# Patient Record
Sex: Male | Born: 1965 | Race: White | Hispanic: No | Marital: Married | Smoking: Former smoker
Health system: Southern US, Community
[De-identification: ages and names within clinical notes are randomized; demographics above are authoritative.]

## PROBLEM LIST (undated history)

## (undated) DIAGNOSIS — R011 Cardiac murmur, unspecified: Secondary | ICD-10-CM

## (undated) DIAGNOSIS — K219 Gastro-esophageal reflux disease without esophagitis: Secondary | ICD-10-CM

## (undated) DIAGNOSIS — I10 Essential (primary) hypertension: Secondary | ICD-10-CM

## (undated) DIAGNOSIS — M5410 Radiculopathy, site unspecified: Secondary | ICD-10-CM

## (undated) DIAGNOSIS — G4730 Sleep apnea, unspecified: Secondary | ICD-10-CM

## (undated) HISTORY — PX: BACK SURGERY: SHX140

## (undated) HISTORY — PX: KNEE ARTHROSCOPY W/ ACL RECONSTRUCTION: SHX1858

## (undated) HISTORY — PX: HERNIA REPAIR: SHX51

---

## 2015-06-02 ENCOUNTER — Other Ambulatory Visit: Payer: Self-pay | Admitting: Orthopedic Surgery

## 2015-06-12 ENCOUNTER — Encounter (HOSPITAL_COMMUNITY): Payer: Self-pay

## 2015-06-12 ENCOUNTER — Ambulatory Visit (HOSPITAL_COMMUNITY)
Admission: RE | Admit: 2015-06-12 | Discharge: 2015-06-12 | Disposition: A | Discharge status: Home / Self Care | Payer: Worker's Compensation | Source: Ambulatory Visit | Attending: Orthopedic Surgery | Admitting: Orthopedic Surgery

## 2015-06-12 ENCOUNTER — Encounter (HOSPITAL_COMMUNITY)
Admission: RE | Admit: 2015-06-12 | Discharge: 2015-06-12 | Disposition: A | Discharge status: Home / Self Care | Payer: Worker's Compensation | Source: Ambulatory Visit | Attending: Orthopedic Surgery | Admitting: Orthopedic Surgery

## 2015-06-12 DIAGNOSIS — R9431 Abnormal electrocardiogram [ECG] [EKG]: Secondary | ICD-10-CM | POA: Diagnosis not present

## 2015-06-12 DIAGNOSIS — Z01818 Encounter for other preprocedural examination: Secondary | ICD-10-CM

## 2015-06-12 DIAGNOSIS — Z87891 Personal history of nicotine dependence: Secondary | ICD-10-CM | POA: Insufficient documentation

## 2015-06-12 DIAGNOSIS — Z7982 Long term (current) use of aspirin: Secondary | ICD-10-CM | POA: Diagnosis not present

## 2015-06-12 DIAGNOSIS — Z0183 Encounter for blood typing: Secondary | ICD-10-CM | POA: Diagnosis not present

## 2015-06-12 DIAGNOSIS — Z79899 Other long term (current) drug therapy: Secondary | ICD-10-CM | POA: Insufficient documentation

## 2015-06-12 DIAGNOSIS — M5410 Radiculopathy, site unspecified: Secondary | ICD-10-CM | POA: Diagnosis not present

## 2015-06-12 DIAGNOSIS — K219 Gastro-esophageal reflux disease without esophagitis: Secondary | ICD-10-CM | POA: Diagnosis not present

## 2015-06-12 DIAGNOSIS — G4733 Obstructive sleep apnea (adult) (pediatric): Secondary | ICD-10-CM | POA: Insufficient documentation

## 2015-06-12 DIAGNOSIS — I10 Essential (primary) hypertension: Secondary | ICD-10-CM | POA: Insufficient documentation

## 2015-06-12 DIAGNOSIS — Z01812 Encounter for preprocedural laboratory examination: Secondary | ICD-10-CM | POA: Diagnosis not present

## 2015-06-12 HISTORY — DX: Gastro-esophageal reflux disease without esophagitis: K21.9

## 2015-06-12 HISTORY — DX: Essential (primary) hypertension: I10

## 2015-06-12 HISTORY — DX: Cardiac murmur, unspecified: R01.1

## 2015-06-12 HISTORY — DX: Radiculopathy, site unspecified: M54.10

## 2015-06-12 HISTORY — DX: Sleep apnea, unspecified: G47.30

## 2015-06-12 LAB — URINALYSIS, ROUTINE W REFLEX MICROSCOPIC
Bilirubin Urine: NEGATIVE
Glucose, UA: NEGATIVE mg/dL
Hgb urine dipstick: NEGATIVE
KETONES UR: NEGATIVE mg/dL
NITRITE: NEGATIVE
PH: 6.5 (ref 5.0–8.0)
PROTEIN: NEGATIVE mg/dL
Specific Gravity, Urine: 1.015 (ref 1.005–1.030)

## 2015-06-12 LAB — CBC WITH DIFFERENTIAL/PLATELET
BASOS ABS: 0 10*3/uL (ref 0.0–0.1)
BASOS PCT: 0 %
EOS PCT: 3 %
Eosinophils Absolute: 0.2 10*3/uL (ref 0.0–0.7)
HCT: 44.4 % (ref 39.0–52.0)
Hemoglobin: 15.1 g/dL (ref 13.0–17.0)
Lymphocytes Relative: 30 %
Lymphs Abs: 2.1 10*3/uL (ref 0.7–4.0)
MCH: 30.5 pg (ref 26.0–34.0)
MCHC: 34 g/dL (ref 30.0–36.0)
MCV: 89.7 fL (ref 78.0–100.0)
MONO ABS: 0.5 10*3/uL (ref 0.1–1.0)
Monocytes Relative: 7 %
Neutro Abs: 4.3 10*3/uL (ref 1.7–7.7)
Neutrophils Relative %: 60 %
PLATELETS: 198 10*3/uL (ref 150–400)
RBC: 4.95 MIL/uL (ref 4.22–5.81)
RDW: 12.1 % (ref 11.5–15.5)
WBC: 7.1 10*3/uL (ref 4.0–10.5)

## 2015-06-12 LAB — COMPREHENSIVE METABOLIC PANEL
ALT: 59 U/L (ref 17–63)
AST: 56 U/L — AB (ref 15–41)
Albumin: 4 g/dL (ref 3.5–5.0)
Alkaline Phosphatase: 58 U/L (ref 38–126)
Anion gap: 7 (ref 5–15)
BUN: 10 mg/dL (ref 6–20)
CHLORIDE: 105 mmol/L (ref 101–111)
CO2: 27 mmol/L (ref 22–32)
Calcium: 9.7 mg/dL (ref 8.9–10.3)
Creatinine, Ser: 0.79 mg/dL (ref 0.61–1.24)
GFR calc Af Amer: >60 mL/min (ref >60)
Glucose, Bld: 120 mg/dL — ABNORMAL HIGH (ref 65–99)
POTASSIUM: 3.7 mmol/L (ref 3.5–5.1)
Sodium: 139 mmol/L (ref 135–145)
Total Bilirubin: 0.7 mg/dL (ref 0.3–1.2)
Total Protein: 7 g/dL (ref 6.5–8.1)

## 2015-06-12 LAB — TYPE AND SCREEN
ABO/RH(D): A POS
ANTIBODY SCREEN: NEGATIVE

## 2015-06-12 LAB — ABO/RH: ABO/RH(D): A POS

## 2015-06-12 LAB — URINE MICROSCOPIC-ADD ON
RBC / HPF: NONE SEEN RBC/hpf (ref 0–5)
Squamous Epithelial / LPF: NONE SEEN

## 2015-06-12 LAB — PROTIME-INR
INR: 1.12 (ref 0.00–1.49)
PROTHROMBIN TIME: 14.6 s (ref 11.6–15.2)

## 2015-06-12 LAB — SURGICAL PCR SCREEN
MRSA, PCR: NEGATIVE
STAPHYLOCOCCUS AUREUS: POSITIVE — AB

## 2015-06-12 LAB — APTT: APTT: 29 s (ref 24–37)
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
## 2015-06-12 NOTE — Progress Notes (Signed)
Pt denies SOB, chest pain, and being under the care of a cardiologist. Pt denies having a stress test, echo and cardiac cath. Pt denies having a chest x ray and EKG within the last year. Pt made aware to stop taking Aspirin, vitamins, fish oil and herbal medications. Do not take any NSAIDs ie: Ibuprofen, Advil, Naproxen, BC and Goody Powder or any medication containing Aspirin. Pt verbalized understanding of all pre-op instructions. 

## 2015-06-12 NOTE — Progress Notes (Signed)
Please treat with Betadine on DOS for positive Staph result. Pt chart forwarded to anesthesia to review EKG.

## 2015-06-12 NOTE — Pre-Procedure Instructions (Addendum)
Earl LitesGregory Hession  06/12/2015      [facility] [place] [address] [place] [id number] Phone: [phone] Fax: [phone]    Your procedure is scheduled on Thursday, June 15, 2015  [facility] at 5:30 A.M.  Call this number if you have problems the morning of surgery:  [phone]   Remember:  Do not eat food or drink liquids after midnight Wednesday, June 14, 2015  Take these medicines the morning of surgery with A SIP OF WATER : atenolol (TENORMIN), omeprazole (PRILOSEC)  Stop taking Aspirin, vitamins, fish oil and herbal medications. Do not take any NSAIDs ie: Ibuprofen, Advil, Naproxen, BC Powder and Goody's or any medication containing Aspirin; stop now.  Do not wear jewelry, make-up or nail polish.  Do not wear lotions, powders, or perfumes.  You may not wear deodorant.  Do not shave 48 hours prior to surgery.  Men may shave face and neck.  Do not bring valuables to the hospital.  [facility] is not responsible for any belongings or valuables.  Contacts, dentures or bridgework may not be worn into surgery.  Leave your suitcase in the car.  After surgery it may be brought to your room.  For patients admitted to the hospital, discharge time will be determined by your treatment team.  Patients discharged the day of surgery will not be allowed to drive home.   Name and phone number of your driver:  Special instructions:  Special Instructions:Special Instructions: [facility] - Preparing for [facility]  Before surgery, you can play an important role.  Because skin is not sterile, your skin needs to be as free of germs as possible.  You can reduce the number of germs on you skin by washing with CHG (chlorahexidine gluconate) soap before surgery.  CHG is an antiseptic cleaner which kills germs and bonds with the skin to continue killing germs even after washing.  Please DO NOT use if you have an  allergy to CHG or antibacterial soaps.  If your skin becomes reddened/irritated stop using the CHG and inform your nurse when you arrive at Short Stay.  Do not shave (including legs and underarms) for at least 48 hours prior to the first CHG shower.  You may shave your face.  Please follow these instructions carefully:   1.  Shower with CHG Soap the night before surgery and the morning of Surgery.  2.  If you choose to wash your hair, wash your hair first as usual with your normal shampoo.  3.  After you shampoo, rinse your hair and body thoroughly to remove the Shampoo.  4.  Use CHG as you would any other liquid soap.  You can apply chg directly  to the skin and wash gently with scrungie or a clean washcloth.  5.  Apply the CHG Soap to your body ONLY FROM THE NECK DOWN.  Do not use on open wounds or open sores.  Avoid contact with your eyes, ears, mouth and genitals (private parts).  Wash genitals (private parts) with your normal soap.  6.  Wash thoroughly, paying special attention to the area where your surgery will be performed.  7.  Thoroughly rinse your body with warm water from the neck down.  8.  DO NOT shower/wash with your normal soap after using and rinsing off the CHG Soap.  9.  Pat yourself dry with a clean towel.  10.  Wear clean pajamas.            11.  Place clean sheets on your bed the night of your first shower and do not sleep with pets.  Day of Surgery  Do not apply any lotions/deodorants the morning of surgery.  Please wear clean clothes to the hospital/surgery center.  Please read over the following fact sheets that you were given. Pain Booklet, Coughing and Deep Breathing, Blood Transfusion Information, MRSA Information and Surgical Site Infection Prevention

## 2015-06-13 NOTE — Progress Notes (Signed)
Anesthesia Chart Review:  Pt is a 50 year old male scheduled for C5-6, C6-7 posterior cervical decompression fusion on 06/15/2015 with Dr. Yevette Edwardsumonski.   PCP is Elder Negusavid Sanders, PA  PMH includes:  HTN, heart murmur (as a child), OSA, GERD. Former smoker. BMI 39  Medications include: ASA, atenolol, lisinopril-hctz, prilosec, crestor  Preoperative labs reviewed.    Chest x-ray 06/12/15 reviewed. No active cardiopulmonary disease  EKG 06/12/15: Sinus rhythm with 1st degree A-V block. LAD. Non-specific intra-ventricular conduction block  If no changes, I anticipate pt can proceed with surgery as scheduled.   Rica Mastngela Laird Runnion, FNP-BC [facility]/Anesthesiology Phone: [phone] 06/13/2015 12:53 PM

## 2015-06-14 MED ORDER — DEXTROSE 5 % IV SOLN
3.0000 g | INTRAVENOUS | Status: AC
  Administered 2015-06-15: 3 g via INTRAVENOUS
  Filled 2015-06-14: qty 3000

## 2015-06-14 NOTE — Anesthesia Preprocedure Evaluation (Addendum)
Anesthesia Evaluation  Patient identified by MRN, date of birth, ID band Patient awake    Reviewed: Allergy & Precautions, NPO status , Patient's Chart, lab work & pertinent test results  Airway Mallampati: II  TM Distance: >3 FB Neck ROM: Full    Dental  (+) Teeth Intact, Dental Advisory Given   Pulmonary sleep apnea and Continuous Positive Airway Pressure Ventilation , former smoker,    breath sounds clear to auscultation       Cardiovascular hypertension, Pt. on medications and Pt. on home beta blockers  Rhythm:Regular Rate:Normal     Neuro/Psych  Neuromuscular disease    GI/Hepatic Neg liver ROS, GERD  Medicated and Controlled,  Endo/Other  negative endocrine ROSMorbid obesity  Renal/GU negative Renal ROS     Musculoskeletal negative musculoskeletal ROS (+)   Abdominal   Peds  Hematology negative hematology ROS (+)   Anesthesia Other Findings   Reproductive/Obstetrics                           Lab Results  Component Value Date   WBC 7.1 06/12/2015   HGB 15.1 06/12/2015   HCT 44.4 06/12/2015   MCV 89.7 06/12/2015   PLT 198 06/12/2015   Lab Results  Component Value Date   CREATININE 0.79 06/12/2015   BUN 10 06/12/2015   NA 139 06/12/2015   K 3.7 06/12/2015   CL 105 06/12/2015   CO2 27 06/12/2015    Anesthesia Physical Anesthesia Plan  ASA: II  Anesthesia Plan: General   Post-op Pain Management:    Induction: Intravenous  Airway Management Planned: Oral ETT and Video Laryngoscope Planned  Additional Equipment:   Intra-op Plan:   Post-operative Plan: Extubation in OR  Informed Consent: I have reviewed the patients History and Physical, chart, labs and discussed the procedure including the risks, benefits and alternatives for the proposed anesthesia with the patient or authorized representative who has indicated his/her understanding and acceptance.   Dental  advisory given  Plan Discussed with: CRNA, Anesthesiologist and Surgeon  Anesthesia Plan Comments:       Anesthesia Quick Evaluation

## 2015-06-15 ENCOUNTER — Observation Stay (HOSPITAL_COMMUNITY)
Admission: RE | Admit: 2015-06-15 | Discharge: 2015-06-16 | Disposition: A | Discharge status: Home / Self Care | Payer: Worker's Compensation | Source: Ambulatory Visit | Attending: Orthopedic Surgery | Admitting: Orthopedic Surgery

## 2015-06-15 ENCOUNTER — Encounter (HOSPITAL_COMMUNITY)
Admission: RE | Disposition: A | Discharge status: Home / Self Care | Payer: Self-pay | Source: Ambulatory Visit | Attending: Orthopedic Surgery

## 2015-06-15 ENCOUNTER — Ambulatory Visit (HOSPITAL_COMMUNITY): Payer: Worker's Compensation | Admitting: Anesthesiology

## 2015-06-15 ENCOUNTER — Ambulatory Visit (HOSPITAL_COMMUNITY): Payer: Worker's Compensation

## 2015-06-15 ENCOUNTER — Ambulatory Visit (HOSPITAL_COMMUNITY): Payer: Worker's Compensation | Admitting: Emergency Medicine

## 2015-06-15 ENCOUNTER — Encounter (HOSPITAL_COMMUNITY): Payer: Self-pay | Admitting: *Deleted

## 2015-06-15 DIAGNOSIS — K219 Gastro-esophageal reflux disease without esophagitis: Secondary | ICD-10-CM | POA: Diagnosis not present

## 2015-06-15 DIAGNOSIS — G4730 Sleep apnea, unspecified: Secondary | ICD-10-CM | POA: Diagnosis not present

## 2015-06-15 DIAGNOSIS — I10 Essential (primary) hypertension: Secondary | ICD-10-CM | POA: Insufficient documentation

## 2015-06-15 DIAGNOSIS — Z6839 Body mass index (BMI) 39.0-39.9, adult: Secondary | ICD-10-CM | POA: Insufficient documentation

## 2015-06-15 DIAGNOSIS — S129XXA Fracture of neck, unspecified, initial encounter: Secondary | ICD-10-CM

## 2015-06-15 DIAGNOSIS — M960 Pseudarthrosis after fusion or arthrodesis: Secondary | ICD-10-CM | POA: Diagnosis present

## 2015-06-15 HISTORY — PX: POSTERIOR CERVICAL FUSION/FORAMINOTOMY: SHX5038

## 2015-06-15 SURGERY — POSTERIOR CERVICAL FUSION/FORAMINOTOMY LEVEL 2
Anesthesia: General

## 2015-06-15 MED ORDER — MENTHOL 3 MG MT LOZG: 1.0000 | LOZENGE | OROMUCOSAL | Status: DC | PRN

## 2015-06-15 MED ORDER — POVIDONE-IODINE 7.5 % EX SOLN
Freq: Once | CUTANEOUS | Status: DC
  Filled 2015-06-15: qty 118

## 2015-06-15 MED ORDER — ATENOLOL 100 MG PO TABS
100.0000 mg | ORAL_TABLET | ORAL | Status: DC
  Administered 2015-06-16: 100 mg via ORAL
  Filled 2015-06-15: qty 1

## 2015-06-15 MED ORDER — ROCURONIUM BROMIDE 50 MG/5ML IV SOLN
INTRAVENOUS | Status: AC
  Filled 2015-06-15: qty 1

## 2015-06-15 MED ORDER — FENTANYL CITRATE (PF) 100 MCG/2ML IJ SOLN
INTRAMUSCULAR | Status: DC | PRN
  Administered 2015-06-15: 50 ug via INTRAVENOUS
  Administered 2015-06-15: 100 ug via INTRAVENOUS
  Administered 2015-06-15 (×5): 50 ug via INTRAVENOUS

## 2015-06-15 MED ORDER — ALUM & MAG HYDROXIDE-SIMETH 200-200-20 MG/5ML PO SUSP: 30.0000 mL | Freq: Four times a day (QID) | ORAL | Status: DC | PRN

## 2015-06-15 MED ORDER — HYDROMORPHONE HCL 1 MG/ML IJ SOLN
0.2500 mg | INTRAMUSCULAR | Status: DC | PRN
  Administered 2015-06-15 (×2): 0.5 mg via INTRAVENOUS

## 2015-06-15 MED ORDER — PROPOFOL 10 MG/ML IV BOLUS
INTRAVENOUS | Status: DC | PRN
  Administered 2015-06-15: 40 mg via INTRAVENOUS
  Administered 2015-06-15: 200 mg via INTRAVENOUS

## 2015-06-15 MED ORDER — BACITRACIN ZINC 500 UNIT/GM EX OINT
TOPICAL_OINTMENT | CUTANEOUS | Status: AC
  Filled 2015-06-15: qty 28.35

## 2015-06-15 MED ORDER — HYDROCHLOROTHIAZIDE 25 MG PO TABS
25.0000 mg | ORAL_TABLET | Freq: Every day | ORAL | Status: DC
  Administered 2015-06-15: 25 mg via ORAL
  Filled 2015-06-15: qty 1

## 2015-06-15 MED ORDER — BISACODYL 5 MG PO TBEC: 5.0000 mg | DELAYED_RELEASE_TABLET | Freq: Every day | ORAL | Status: DC | PRN

## 2015-06-15 MED ORDER — ACETAMINOPHEN 325 MG PO TABS: 650.0000 mg | ORAL_TABLET | ORAL | Status: DC | PRN

## 2015-06-15 MED ORDER — SODIUM CHLORIDE 0.9% FLUSH
3.0000 mL | Freq: Two times a day (BID) | INTRAVENOUS | Status: DC
  Administered 2015-06-15 (×2): 3 mL via INTRAVENOUS

## 2015-06-15 MED ORDER — PROPOFOL 10 MG/ML IV BOLUS
INTRAVENOUS | Status: AC
  Filled 2015-06-15: qty 20

## 2015-06-15 MED ORDER — BUPIVACAINE-EPINEPHRINE 0.25% -1:200000 IJ SOLN
INTRAMUSCULAR | Status: DC | PRN
  Administered 2015-06-15: 10 mL

## 2015-06-15 MED ORDER — FENTANYL CITRATE (PF) 250 MCG/5ML IJ SOLN
INTRAMUSCULAR | Status: AC
  Filled 2015-06-15: qty 5

## 2015-06-15 MED ORDER — THROMBIN 20000 UNITS EX SOLR
CUTANEOUS | Status: AC
  Filled 2015-06-15: qty 20000

## 2015-06-15 MED ORDER — LACTATED RINGERS IV SOLN
INTRAVENOUS | Status: DC | PRN
  Administered 2015-06-15: 07:00:00 via INTRAVENOUS

## 2015-06-15 MED ORDER — THROMBIN 20000 UNITS EX KIT
PACK | CUTANEOUS | Status: DC | PRN
  Administered 2015-06-15: 20000 [IU] via TOPICAL

## 2015-06-15 MED ORDER — MORPHINE SULFATE (PF) 2 MG/ML IV SOLN
1.0000 mg | INTRAVENOUS | Status: DC | PRN
  Administered 2015-06-15: 2 mg via INTRAVENOUS
  Filled 2015-06-15: qty 1

## 2015-06-15 MED ORDER — LIDOCAINE HCL (CARDIAC) 20 MG/ML IV SOLN
INTRAVENOUS | Status: DC | PRN
  Administered 2015-06-15: 80 mg via INTRAVENOUS

## 2015-06-15 MED ORDER — MINERAL OIL LIGHT 100 % EX OIL
TOPICAL_OIL | CUTANEOUS | Status: DC | PRN
  Administered 2015-06-15: 1 via TOPICAL

## 2015-06-15 MED ORDER — DIAZEPAM 5 MG PO TABS
ORAL_TABLET | ORAL | Status: AC
  Filled 2015-06-15: qty 1

## 2015-06-15 MED ORDER — FLEET ENEMA 7-19 GM/118ML RE ENEM: 1.0000 | ENEMA | Freq: Once | RECTAL | Status: DC | PRN

## 2015-06-15 MED ORDER — OXYCODONE-ACETAMINOPHEN 5-325 MG PO TABS
1.0000 | ORAL_TABLET | ORAL | Status: DC | PRN
  Administered 2015-06-15 – 2015-06-16 (×4): 2 via ORAL
  Filled 2015-06-15 (×3): qty 2

## 2015-06-15 MED ORDER — MIDAZOLAM HCL 2 MG/2ML IJ SOLN
INTRAMUSCULAR | Status: AC
  Filled 2015-06-15: qty 2

## 2015-06-15 MED ORDER — 0.9 % SODIUM CHLORIDE (POUR BTL) OPTIME
TOPICAL | Status: DC | PRN
  Administered 2015-06-15 (×2): 1000 mL

## 2015-06-15 MED ORDER — SUGAMMADEX SODIUM 200 MG/2ML IV SOLN
INTRAVENOUS | Status: DC | PRN
  Administered 2015-06-15: 200 mg via INTRAVENOUS

## 2015-06-15 MED ORDER — ROCURONIUM BROMIDE 50 MG/5ML IV SOLN
INTRAVENOUS | Status: AC
Start: 2015-06-15 — End: 2015-06-15
  Filled 2015-06-15: qty 2

## 2015-06-15 MED ORDER — EPHEDRINE 5 MG/ML INJ
INTRAVENOUS | Status: AC
  Filled 2015-06-15: qty 10

## 2015-06-15 MED ORDER — VITAMIN D 1000 UNITS PO TABS
2000.0000 [IU] | ORAL_TABLET | ORAL | Status: DC
  Administered 2015-06-16: 2000 [IU] via ORAL
  Filled 2015-06-15: qty 2

## 2015-06-15 MED ORDER — PHENOL 1.4 % MT LIQD
1.0000 | OROMUCOSAL | Status: DC | PRN
Start: 2015-06-15 — End: 2015-06-16

## 2015-06-15 MED ORDER — BACITRACIN ZINC 500 UNIT/GM EX OINT
TOPICAL_OINTMENT | CUTANEOUS | Status: DC | PRN
  Administered 2015-06-15: 1 via TOPICAL

## 2015-06-15 MED ORDER — DIAZEPAM 5 MG PO TABS
5.0000 mg | ORAL_TABLET | Freq: Four times a day (QID) | ORAL | Status: DC | PRN
  Administered 2015-06-15 – 2015-06-16 (×3): 5 mg via ORAL
  Filled 2015-06-15 (×2): qty 1

## 2015-06-15 MED ORDER — ONDANSETRON HCL 4 MG/2ML IJ SOLN
INTRAMUSCULAR | Status: AC
  Filled 2015-06-15: qty 2

## 2015-06-15 MED ORDER — OXYCODONE-ACETAMINOPHEN 5-325 MG PO TABS
ORAL_TABLET | ORAL | Status: AC
  Filled 2015-06-15: qty 2

## 2015-06-15 MED ORDER — PROMETHAZINE HCL 25 MG/ML IJ SOLN: 6.2500 mg | INTRAMUSCULAR | Status: DC | PRN

## 2015-06-15 MED ORDER — ONDANSETRON HCL 4 MG/2ML IJ SOLN
4.0000 mg | INTRAMUSCULAR | Status: DC | PRN
Start: 2015-06-15 — End: 2015-06-16

## 2015-06-15 MED ORDER — HYDROCODONE-ACETAMINOPHEN 5-325 MG PO TABS
1.0000 | ORAL_TABLET | ORAL | Status: DC | PRN
Start: 2015-06-15 — End: 2015-06-16

## 2015-06-15 MED ORDER — MINERAL OIL LIGHT 100 % EX OIL
TOPICAL_OIL | CUTANEOUS | Status: AC
  Filled 2015-06-15: qty 25

## 2015-06-15 MED ORDER — HYDROCODONE-ACETAMINOPHEN 7.5-325 MG PO TABS
1.0000 | ORAL_TABLET | Freq: Once | ORAL | Status: DC | PRN
Start: 2015-06-15 — End: 2015-06-15

## 2015-06-15 MED ORDER — PHENYLEPHRINE HCL 10 MG/ML IJ SOLN
10.0000 mg | INTRAVENOUS | Status: DC | PRN
  Administered 2015-06-15: 40 ug/min via INTRAVENOUS

## 2015-06-15 MED ORDER — SODIUM CHLORIDE 0.9% FLUSH: 3.0000 mL | INTRAVENOUS | Status: DC | PRN

## 2015-06-15 MED ORDER — MIDAZOLAM HCL 5 MG/5ML IJ SOLN
INTRAMUSCULAR | Status: DC | PRN
  Administered 2015-06-15: 2 mg via INTRAVENOUS

## 2015-06-15 MED ORDER — HYDROMORPHONE HCL 1 MG/ML IJ SOLN
INTRAMUSCULAR | Status: AC
  Administered 2015-06-15: 0.5 mg via INTRAVENOUS
  Filled 2015-06-15: qty 1

## 2015-06-15 MED ORDER — LACTATED RINGERS IV SOLN
INTRAVENOUS | Status: DC | PRN
  Administered 2015-06-15 (×2): via INTRAVENOUS

## 2015-06-15 MED ORDER — LISINOPRIL-HYDROCHLOROTHIAZIDE 20-25 MG PO TABS: 1.0000 | ORAL_TABLET | ORAL | Status: DC

## 2015-06-15 MED ORDER — LISINOPRIL 20 MG PO TABS
20.0000 mg | ORAL_TABLET | Freq: Every day | ORAL | Status: DC
  Administered 2015-06-15: 20 mg via ORAL
  Filled 2015-06-15: qty 1

## 2015-06-15 MED ORDER — ONDANSETRON HCL 4 MG/2ML IJ SOLN
INTRAMUSCULAR | Status: DC | PRN
  Administered 2015-06-15: 4 mg via INTRAVENOUS

## 2015-06-15 MED ORDER — MUPIROCIN 2 % EX OINT
1.0000 "application " | TOPICAL_OINTMENT | Freq: Two times a day (BID) | CUTANEOUS | Status: DC
  Administered 2015-06-15: 1 via NASAL

## 2015-06-15 MED ORDER — ROSUVASTATIN CALCIUM 20 MG PO TABS: 40.0000 mg | ORAL_TABLET | Freq: Every day | ORAL | Status: DC

## 2015-06-15 MED ORDER — ZOLPIDEM TARTRATE 5 MG PO TABS: 5.0000 mg | ORAL_TABLET | Freq: Every evening | ORAL | Status: DC | PRN

## 2015-06-15 MED ORDER — SUCCINYLCHOLINE CHLORIDE 200 MG/10ML IV SOSY
PREFILLED_SYRINGE | INTRAVENOUS | Status: AC
  Filled 2015-06-15: qty 10

## 2015-06-15 MED ORDER — SENNOSIDES-DOCUSATE SODIUM 8.6-50 MG PO TABS: 1.0000 | ORAL_TABLET | Freq: Every evening | ORAL | Status: DC | PRN

## 2015-06-15 MED ORDER — ACETAMINOPHEN 650 MG RE SUPP: 650.0000 mg | RECTAL | Status: DC | PRN

## 2015-06-15 MED ORDER — PANTOPRAZOLE SODIUM 40 MG PO TBEC
40.0000 mg | DELAYED_RELEASE_TABLET | Freq: Every day | ORAL | Status: DC
  Administered 2015-06-16: 40 mg via ORAL
  Filled 2015-06-15 (×2): qty 1

## 2015-06-15 MED ORDER — CEFAZOLIN SODIUM 1-5 GM-% IV SOLN
1.0000 g | Freq: Three times a day (TID) | INTRAVENOUS | Status: AC
  Administered 2015-06-15 (×2): 1 g via INTRAVENOUS
  Filled 2015-06-15 (×2): qty 50

## 2015-06-15 MED ORDER — ROCURONIUM BROMIDE 100 MG/10ML IV SOLN
INTRAVENOUS | Status: DC | PRN
  Administered 2015-06-15 (×2): 20 mg via INTRAVENOUS
  Administered 2015-06-15: 50 mg via INTRAVENOUS
  Administered 2015-06-15 (×2): 10 mg via INTRAVENOUS
  Administered 2015-06-15 (×2): 20 mg via INTRAVENOUS

## 2015-06-15 MED ORDER — DOCUSATE SODIUM 100 MG PO CAPS
100.0000 mg | ORAL_CAPSULE | Freq: Two times a day (BID) | ORAL | Status: DC
  Administered 2015-06-15: 100 mg via ORAL
  Filled 2015-06-15: qty 1

## 2015-06-15 SURGICAL SUPPLY — 70 items
BENZOIN TINCTURE PRP APPL 2/3 (GAUZE/BANDAGES/DRESSINGS) ×4 IMPLANT
BIT DRILL MOUNTAINEER FIX 14 (BIT) ×1
BIT DRILL MOUNTAINEER FIX 14MM (BIT) ×1 IMPLANT
BLADE CLIPPER SURG NEURO (BLADE) IMPLANT
BUR NEURO DRILL SOFT 3.0X3.8M (BURR) ×2 IMPLANT
BUR PRESCISION 1.7 ELITE (BURR) ×2 IMPLANT
CONT SPEC STER OR (MISCELLANEOUS) ×2 IMPLANT
CORDS BIPOLAR (ELECTRODE) ×2 IMPLANT
COVER SURGICAL LIGHT HANDLE (MISCELLANEOUS) ×2 IMPLANT
DRAIN CHANNEL 15F RND FF W/TCR (WOUND CARE) IMPLANT
DRAPE C-ARM 42X72 X-RAY (DRAPES) ×2 IMPLANT
DRAPE INCISE IOBAN 66X45 STRL (DRAPES) ×2 IMPLANT
DRAPE PED LAPAROTOMY (DRAPES) ×2 IMPLANT
DRAPE POUCH INSTRU U-SHP 10X18 (DRAPES) ×2 IMPLANT
DRAPE PROXIMA HALF (DRAPES) ×10 IMPLANT
DRAPE SURG 17X23 STRL (DRAPES) ×16 IMPLANT
DRAPE TABLE COVER HEAVY DUTY (DRAPES) ×2 IMPLANT
DRILL BIT MOUNTAINEER FIX 14MM (BIT) ×1
DRSG MEPILEX BORDER 4X8 (GAUZE/BANDAGES/DRESSINGS) IMPLANT
DURAPREP 26ML APPLICATOR (WOUND CARE) ×2 IMPLANT
ELECT CAUTERY BLADE 6.4 (BLADE) ×2 IMPLANT
ELECT REM PT RETURN 9FT ADLT (ELECTROSURGICAL) ×2
ELECTRODE REM PT RTRN 9FT ADLT (ELECTROSURGICAL) ×1 IMPLANT
EVACUATOR SILICONE 100CC (DRAIN) IMPLANT
GAUZE SPONGE 4X4 12PLY STRL (GAUZE/BANDAGES/DRESSINGS) ×2 IMPLANT
GAUZE SPONGE 4X4 16PLY XRAY LF (GAUZE/BANDAGES/DRESSINGS) ×8 IMPLANT
GLOVE BIO SURGEON STRL SZ7 (GLOVE) ×2 IMPLANT
GLOVE BIO SURGEON STRL SZ8 (GLOVE) ×2 IMPLANT
GLOVE BIOGEL PI IND STRL 8 (GLOVE) ×1 IMPLANT
GLOVE BIOGEL PI INDICATOR 8 (GLOVE) ×1
GOWN STRL REUS W/ TWL LRG LVL3 (GOWN DISPOSABLE) ×4 IMPLANT
GOWN STRL REUS W/ TWL XL LVL3 (GOWN DISPOSABLE) ×1 IMPLANT
GOWN STRL REUS W/TWL LRG LVL3 (GOWN DISPOSABLE) ×4
GOWN STRL REUS W/TWL XL LVL3 (GOWN DISPOSABLE) ×1
IV CATH 14GX2 1/4 (CATHETERS) ×2 IMPLANT
KIT BASIN OR (CUSTOM PROCEDURE TRAY) ×2 IMPLANT
KIT ROOM TURNOVER OR (KITS) ×2 IMPLANT
NEEDLE HYPO 25GX1X1/2 BEV (NEEDLE) ×2 IMPLANT
NS IRRIG 1000ML POUR BTL (IV SOLUTION) ×2 IMPLANT
PACK LAMINECTOMY ORTHO (CUSTOM PROCEDURE TRAY) ×2 IMPLANT
PAD ARMBOARD 7.5X6 YLW CONV (MISCELLANEOUS) ×4 IMPLANT
PATTIES SURGICAL .5 X.5 (GAUZE/BANDAGES/DRESSINGS) ×2 IMPLANT
PATTIES SURGICAL .5 X1 (DISPOSABLE) ×2 IMPLANT
PIN MAYFIELD SKULL DISP (PIN) ×2 IMPLANT
PUTTY BONE DBX 2.5 MIS (Bone Implant) ×2 IMPLANT
PUTTY DBX 1CC (Putty) ×2 IMPLANT
PUTTY DBX 1CC DEPUY (Putty) ×1 IMPLANT
ROD MOUNTAINEER 120MM (Rod) ×2 IMPLANT
SCREW F A 3.5X14 (Screw) ×4 IMPLANT
SCREW INNER (Screw) ×8 IMPLANT
SCREW MOUNTAINEER 4.0X26MM (Screw) ×4 IMPLANT
SPONGE INTESTINAL PEANUT (DISPOSABLE) ×2 IMPLANT
SPONGE SURGIFOAM ABS GEL 100 (HEMOSTASIS) ×2 IMPLANT
STRIP CLOSURE SKIN 1/2X4 (GAUZE/BANDAGES/DRESSINGS) ×2 IMPLANT
SURGIFLO W/THROMBIN 8M KIT (HEMOSTASIS) IMPLANT
SUT BONE WAX W31G (SUTURE) ×2 IMPLANT
SUT MNCRL AB 4-0 PS2 18 (SUTURE) ×2 IMPLANT
SUT VIC AB 0 CT1 18XCR BRD 8 (SUTURE) ×2 IMPLANT
SUT VIC AB 0 CT1 8-18 (SUTURE) ×2
SUT VIC AB 1 CT1 18XCR BRD 8 (SUTURE) ×3 IMPLANT
SUT VIC AB 1 CT1 8-18 (SUTURE) ×3
SUT VIC AB 2-0 CT2 18 VCP726D (SUTURE) ×4 IMPLANT
SYR BULB IRRIGATION 50ML (SYRINGE) ×2 IMPLANT
SYR CONTROL 10ML LL (SYRINGE) ×2 IMPLANT
TAPE CLOTH 4X10 WHT NS (GAUZE/BANDAGES/DRESSINGS) ×2 IMPLANT
TOWEL OR 17X24 6PK STRL BLUE (TOWEL DISPOSABLE) ×2 IMPLANT
TOWEL OR 17X26 10 PK STRL BLUE (TOWEL DISPOSABLE) ×2 IMPLANT
TRAY FOLEY CATH 16FRSI W/METER (SET/KITS/TRAYS/PACK) ×2 IMPLANT
WATER STERILE IRR 1000ML POUR (IV SOLUTION) ×2 IMPLANT
YANKAUER SUCT BULB TIP NO VENT (SUCTIONS) ×2 IMPLANT

## 2015-06-15 NOTE — Progress Notes (Signed)
Patient stated he was unsure if he wanted to wear his CPAP tonight. RT informed patient to call if he decides he wants to wear one.

## 2015-06-15 NOTE — Progress Notes (Signed)
Orthopedic Tech Progress Note Patient Details:  Daniel Ware 10-16-1965 [id number] Delivered [place] cervical collar to pt.'s nurse. Ortho Devices Type of Ortho Device: [place] cervical collar Ortho Device/Splint Interventions: Other (comment)   Lesle ChrisGilliland, Windel Keziah L 06/15/2015, 5:31 PM

## 2015-06-15 NOTE — Anesthesia Procedure Notes (Signed)
Procedure Name: Intubation Date/Time: 06/15/2015 7:46 AM Performed by: Rogelia BogaMUELLER, Timmie Dugue P Pre-anesthesia Checklist: Patient identified, Emergency Drugs available, Suction available, Patient being monitored and Timeout performed Patient Re-evaluated:Patient Re-evaluated prior to inductionOxygen Delivery Method: Circle system utilized Preoxygenation: Pre-oxygenation with 100% oxygen Intubation Type: IV induction Ventilation: Mask ventilation without difficulty and Oral airway inserted - appropriate to patient size Laryngoscope Size: Glidescope Grade View: Grade I Tube type: Oral Tube size: 7.5 mm Number of attempts: 2 Airway Equipment and Method: Stylet Placement Confirmation: ETT inserted through vocal cords under direct vision,  positive ETCO2 and breath sounds checked- equal and bilateral Secured at: 23 cm Tube secured with: Tape Dental Injury: Teeth and Oropharynx as per pre-operative assessment

## 2015-06-15 NOTE — Transfer of Care (Signed)
Immediate Anesthesia Transfer of Care Note  Patient: Daniel Ware  Procedure(s) Performed: Procedure(s) with comments: POSTERIOR CERVICAL DECOMPRESSION FUSION, CERVICAL 5-6, CERVICAL 6-7 WITH INSTRUMENTATION AND ALLOGRAFT (N/A) - ANTERIOR CERVICAL DECOMPRESSION FUSION, CERVICAL 5-6, CERVICAL 6-7 WITH INSTRUMENTATION AND ALLOGRAFT  Patient Location: PACU  Anesthesia Type:General  Level of Consciousness: awake, alert , oriented and patient cooperative  Airway & Oxygen Therapy: Patient Spontanous Breathing and Patient connected to nasal cannula oxygen  Post-op Assessment: Report given to RN, Post -op Vital signs reviewed and stable and Patient moving all extremities X 4  Post vital signs: Reviewed and stable  Last Vitals:  Filed Vitals:   06/15/15 0602  BP: 133/85  Pulse: 61  Temp: 36.9 C  Resp: 20    Last Pain: There were no vitals filed for this visit.       Complications: No apparent anesthesia complications

## 2015-06-15 NOTE — Anesthesia Postprocedure Evaluation (Signed)
Anesthesia Post Note  Patient: Valeda MalmGregory Clason  Procedure(s) Performed: Procedure(s) (LRB): POSTERIOR CERVICAL DECOMPRESSION FUSION, CERVICAL 5-6, CERVICAL 6-7 WITH INSTRUMENTATION AND ALLOGRAFT (N/A)  Patient location during evaluation: PACU Anesthesia Type: General Level of consciousness: awake and alert Pain management: pain level controlled Vital Signs Assessment: post-procedure vital signs reviewed and stable Respiratory status: spontaneous breathing, nonlabored ventilation, respiratory function stable and patient connected to nasal cannula oxygen Cardiovascular status: blood pressure returned to baseline and stable Postop Assessment: no signs of nausea or vomiting Anesthetic complications: no    Last Vitals:  Filed Vitals:   06/15/15 1348 06/15/15 1350  BP: 159/98   Pulse: 62 72  Temp:  36.6 C  Resp: 7 8    Last Pain:  Filed Vitals:   06/15/15 1353  PainSc: 4                  Kennieth RadFitzgerald, Akansha Wyche E

## 2015-06-16 ENCOUNTER — Encounter (HOSPITAL_COMMUNITY): Payer: Self-pay | Admitting: Orthopedic Surgery

## 2015-06-16 DIAGNOSIS — M960 Pseudarthrosis after fusion or arthrodesis: Secondary | ICD-10-CM | POA: Diagnosis not present

## 2015-06-16 MED FILL — Thrombin For Soln 20000 Unit: CUTANEOUS | Qty: 1 | Status: AC

## 2015-06-16 NOTE — Progress Notes (Signed)
    Patient doing well, states pain is now very different, described muscular pain posteriorly when head is unsupported. Pain is well controlled, has been up and walking. Tolerating brace well. Minimal sleep last night eager to proceed home.    Physical Exam: BP 114/83 mmHg  Pulse 63  Temp(Src) 98 F (36.7 C) (Oral)  Resp 18  Wt 131.09 kg (289 lb)  SpO2 94%  Dressing in place CDI, neck soft and supple, pt sitting up comfortably eating breakfast. NVI  POD #1 s/p C5-8 posterior cervical fusion for non-union   - encourage ambulation - Percocet for pain, Valium for muscle spasms - d/c home today   -Scripts signed in chart  -D/C instructions printed in chart  -[place] collar at bedside  -F/U in office 2 weeks

## 2015-06-16 NOTE — Progress Notes (Signed)
Patient alert and oriented, mae's well, voiding adequate amount of urine, swallowing without difficulty, no c/o pain. Patient discharged home with family. Script and discharged instructions given to patient. Patient and family stated understanding of d/c instructions given and has an appointment with MD. 
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
## 2015-06-23 ENCOUNTER — Encounter (HOSPITAL_COMMUNITY): Payer: Self-pay | Admitting: Orthopedic Surgery
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
## 2017-11-18 IMAGING — CR DG CERVICAL SPINE 2 OR 3 VIEWS
1 series · 1 of 1 positions shown · non-contrast
Comparison: None.

CLINICAL DATA: Posterior cervical decompression and fusion at the
C5-6 and C6-7 levels.

EXAM:
CERVICAL SPINE - 2-3 VIEW; DG C-ARM 61-120 MIN

[AP]
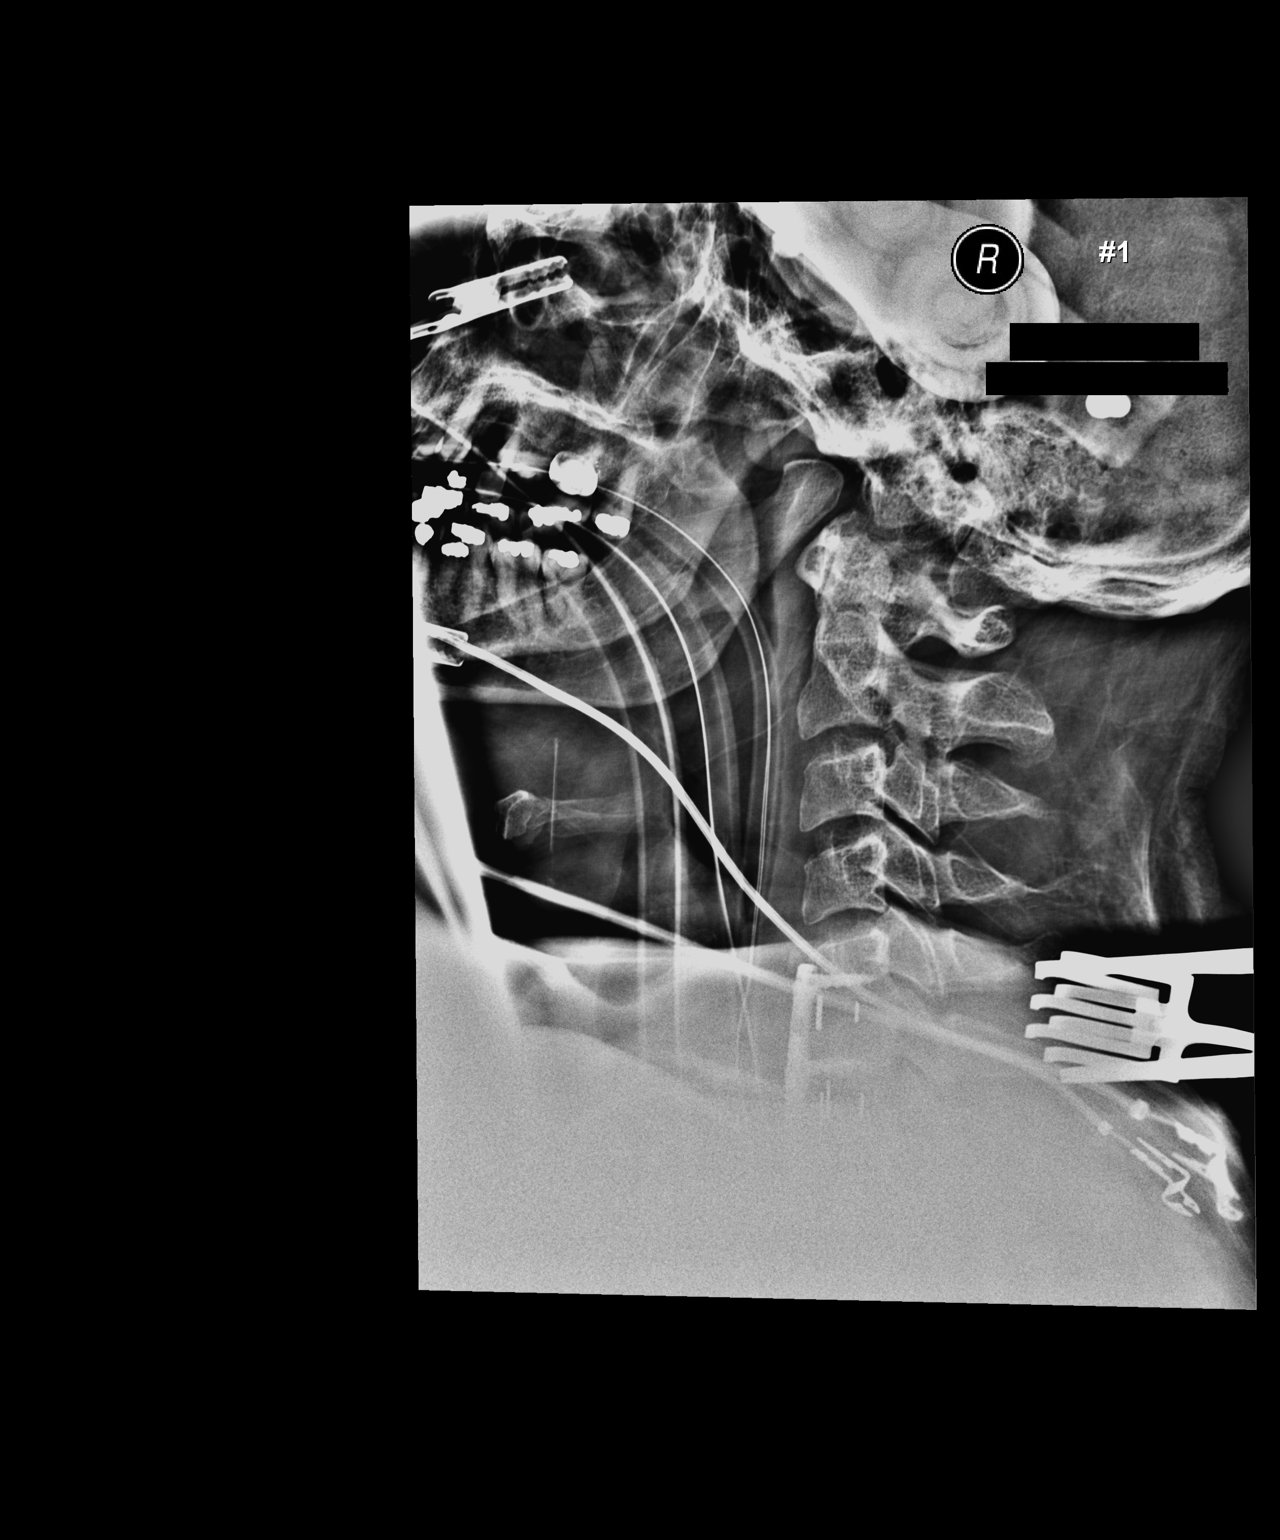

[1 of 1 positions shown; findings below may reference images not displayed]

FINDINGS: A portable cross-table lateral view of the cervical spine and a PA
C-arm image demonstrate interbody bone plug and anterior screw and
plate fusion at the C5 through C7 levels. The alignment cannot be
evaluated due to overlapping of the patient's shoulders on the
cross-table lateral view. The PA view also demonstrates pedicle
screw and rod fixation at those levels.
IMPRESSION: Operative changes at the C5 through C7 levels, as described above.
